# Patient Record
Sex: Female | Born: 1965 | Race: White | Hispanic: No | Marital: Married | Smoking: Never smoker
Health system: Southern US, Community
[De-identification: ages and names within clinical notes are randomized; demographics above are authoritative.]

## PROBLEM LIST (undated history)

## (undated) DIAGNOSIS — C801 Malignant (primary) neoplasm, unspecified: Secondary | ICD-10-CM

## (undated) HISTORY — DX: Malignant (primary) neoplasm, unspecified: C80.1

## (undated) HISTORY — PX: MOHS SURGERY: SUR867

---

## 1998-04-06 ENCOUNTER — Other Ambulatory Visit: Admission: RE | Admit: 1998-04-06 | Discharge: 1998-04-06 | Payer: Self-pay | Admitting: Obstetrics and Gynecology

## 1999-04-23 ENCOUNTER — Other Ambulatory Visit: Admission: RE | Admit: 1999-04-23 | Discharge: 1999-04-23 | Payer: Self-pay | Admitting: Obstetrics and Gynecology

## 2000-10-06 ENCOUNTER — Other Ambulatory Visit: Admission: RE | Admit: 2000-10-06 | Discharge: 2000-10-06 | Payer: Self-pay | Admitting: Obstetrics and Gynecology

## 2001-05-09 ENCOUNTER — Ambulatory Visit (HOSPITAL_COMMUNITY): Admission: RE | Admit: 2001-05-09 | Discharge: 2001-05-09 | Payer: Self-pay | Admitting: *Deleted

## 2001-12-18 ENCOUNTER — Other Ambulatory Visit: Admission: RE | Admit: 2001-12-18 | Discharge: 2001-12-18 | Payer: Self-pay | Admitting: Obstetrics and Gynecology

## 2004-04-23 ENCOUNTER — Other Ambulatory Visit: Admission: RE | Admit: 2004-04-23 | Discharge: 2004-04-23 | Payer: Self-pay | Admitting: Obstetrics and Gynecology

## 2005-10-31 ENCOUNTER — Other Ambulatory Visit: Admission: RE | Admit: 2005-10-31 | Discharge: 2005-10-31 | Payer: Self-pay | Admitting: Obstetrics and Gynecology

## 2006-08-30 ENCOUNTER — Inpatient Hospital Stay (HOSPITAL_COMMUNITY): Admission: AD | Admit: 2006-08-30 | Discharge: 2006-09-02 | Payer: Self-pay | Admitting: Obstetrics and Gynecology

## 2006-08-31 ENCOUNTER — Encounter (INDEPENDENT_AMBULATORY_CARE_PROVIDER_SITE_OTHER): Payer: Self-pay | Admitting: Specialist

## 2006-12-12 ENCOUNTER — Encounter (INDEPENDENT_AMBULATORY_CARE_PROVIDER_SITE_OTHER): Payer: Self-pay | Admitting: Obstetrics and Gynecology

## 2006-12-12 ENCOUNTER — Ambulatory Visit (HOSPITAL_COMMUNITY): Admission: RE | Admit: 2006-12-12 | Discharge: 2006-12-12 | Payer: Self-pay | Admitting: Obstetrics and Gynecology

## 2007-11-12 ENCOUNTER — Encounter: Admission: RE | Admit: 2007-11-12 | Discharge: 2007-11-12 | Payer: Self-pay | Admitting: Obstetrics and Gynecology

## 2008-04-24 ENCOUNTER — Encounter (INDEPENDENT_AMBULATORY_CARE_PROVIDER_SITE_OTHER): Payer: Self-pay | Admitting: Obstetrics and Gynecology

## 2008-04-24 ENCOUNTER — Ambulatory Visit (HOSPITAL_COMMUNITY): Admission: RE | Admit: 2008-04-24 | Discharge: 2008-04-24 | Payer: Self-pay | Admitting: Obstetrics and Gynecology

## 2010-06-25 ENCOUNTER — Inpatient Hospital Stay (HOSPITAL_COMMUNITY)
Admission: AD | Admit: 2010-06-25 | Discharge: 2010-06-27 | Payer: Self-pay | Source: Home / Self Care | Attending: Obstetrics and Gynecology | Admitting: Obstetrics and Gynecology

## 2010-06-27 ENCOUNTER — Encounter
Admission: RE | Admit: 2010-06-27 | Discharge: 2010-07-27 | Payer: Self-pay | Source: Home / Self Care | Attending: Obstetrics and Gynecology | Admitting: Obstetrics and Gynecology

## 2010-07-28 ENCOUNTER — Encounter (HOSPITAL_COMMUNITY): Payer: BC Managed Care – PPO | Attending: Obstetrics and Gynecology

## 2010-07-28 DIAGNOSIS — O923 Agalactia: Secondary | ICD-10-CM | POA: Insufficient documentation

## 2010-08-18 ENCOUNTER — Other Ambulatory Visit: Payer: Self-pay | Admitting: Obstetrics and Gynecology

## 2010-08-27 ENCOUNTER — Encounter (HOSPITAL_COMMUNITY)
Admission: RE | Admit: 2010-08-27 | Discharge: 2010-08-27 | Disposition: A | Discharge status: Home / Self Care | Payer: BC Managed Care – PPO | Source: Ambulatory Visit | Attending: Obstetrics and Gynecology | Admitting: Obstetrics and Gynecology

## 2010-08-27 DIAGNOSIS — O923 Agalactia: Secondary | ICD-10-CM | POA: Insufficient documentation

## 2010-09-06 LAB — CBC
MCH: 32.1 pg (ref 26.0–34.0)
MCV: 91.6 fL (ref 78.0–100.0)
Platelets: 148 10*3/uL — ABNORMAL LOW (ref 150–400)
Platelets: 189 10*3/uL (ref 150–400)
RBC: 3.41 MIL/uL — ABNORMAL LOW (ref 3.87–5.11)
RBC: 3.8 MIL/uL — ABNORMAL LOW (ref 3.87–5.11)
RDW: 13.6 % (ref 11.5–15.5)
RDW: 13.6 % (ref 11.5–15.5)
WBC: 10.8 10*3/uL — ABNORMAL HIGH (ref 4.0–10.5)

## 2010-09-06 LAB — RH IMMUNE GLOB WKUP(>/=20WKS)(NOT WOMEN'S HOSP): Unit division: 0

## 2010-09-06 LAB — RPR: RPR Ser Ql: NONREACTIVE

## 2010-09-27 ENCOUNTER — Encounter (HOSPITAL_COMMUNITY)
Admission: RE | Admit: 2010-09-27 | Discharge: 2010-09-27 | Disposition: A | Discharge status: Home / Self Care | Payer: BC Managed Care – PPO | Source: Ambulatory Visit | Attending: Obstetrics and Gynecology | Admitting: Obstetrics and Gynecology

## 2010-09-27 DIAGNOSIS — O923 Agalactia: Secondary | ICD-10-CM | POA: Insufficient documentation

## 2010-10-28 ENCOUNTER — Encounter (HOSPITAL_COMMUNITY)
Admission: RE | Admit: 2010-10-28 | Discharge: 2010-10-28 | Disposition: A | Discharge status: Home / Self Care | Payer: BC Managed Care – PPO | Source: Ambulatory Visit | Attending: Obstetrics and Gynecology | Admitting: Obstetrics and Gynecology

## 2010-10-28 DIAGNOSIS — O923 Agalactia: Secondary | ICD-10-CM | POA: Insufficient documentation

## 2010-11-09 NOTE — Op Note (Signed)
[name], [name]               ACCOUNT NO.:  [id number]   MEDICAL RECORD NO.:  [id number]          PATIENT TYPE:  AMB   LOCATION:  SDC                           FACILITY:  [facility]   PHYSICIAN:  Lenoard Aden, M.D.DATE OF BIRTH:  16-Apr-1966   DATE OF PROCEDURE:  12/12/2006  DATE OF DISCHARGE:                               OPERATIVE REPORT   PREOPERATIVE DIAGNOSES:  1. Vaginal stenosis.  2. Vaginal wall cyst.   POSTOPERATIVE DIAGNOSES:  1. Vaginal stenosis.  2. Vaginal wall cyst.   PROCEDURE:  Removal of vaginal wall cyst and vaginal paucity with  removal of vaginal wall scar tissue.   SURGEON:  Lenoard Aden, MD.   ANESTHESIA:  MAC and local.   SPECIMENS:  Vaginal wall cyst to Pathology.   The patient to recovery room in good condition.   BRIEF OPERATIVE NOTE:  After being apprised of the risks of anesthesia;  infection, bleeding, injury to the adjacent organs, the possible need  for repair and inability to cure, and dyspareunia, the patient is taken  to the operating room where my assistant administered IV sedation  without difficulty, prepped and draped in the usual sterile fashion,  catheterized until the bladder was empty.  Exam under anesthesia  revealed a stenotic posterior vaginal vault with a vaginal wall  cyst/mass in the area in between the hymenal remnant and the fossa  navicularis.  This area is elevated and excised at the base at this  time.  The posterior wall of the vagina and the area of the posterior  fourchette is excised in a vertical fashion.  This area is undermined  extensively creating a relief of the stenotic area in the posterior area  as noted.  This area, which has been opened in a vertical fashion, is  closed in a horizontal fashion after achieving good hemostasis using  electrocautery.  At this time, it is closed with multiple interrupted  sutures using a #3-0 Vicryl repeat suture, 20 ml of a dilute 0.50%  Marcaine solution is placed,  the specimen is sent to Pathology. The  patient tolerates the procedure well and is awakened and transferred to  recovery in good condition.      Lenoard Aden, M.D.  Electronically Signed     RJT/MEDQ  D:  12/12/2006  T:  12/12/2006  Job:  [id number]

## 2010-11-09 NOTE — Op Note (Signed)
Isabella Davis, Isabella Davis               ACCOUNT NO.:  [id number]   MEDICAL RECORD NO.:  [id number]          PATIENT TYPE:  AMB   LOCATION:  SDC                           FACILITY:  [facility]   PHYSICIAN:  Lenoard Aden, M.D.DATE OF BIRTH:  03/06/1966   DATE OF PROCEDURE:  04/24/2008  DATE OF DISCHARGE:                               OPERATIVE REPORT   PREOPERATIVE DIAGNOSES:  Pregnancy in second trimester, pregnancy loss  at 12 weeks.   POSTOPERATIVE DIAGNOSES:  Pregnancy in second trimester, pregnancy loss  at 12 weeks.   PROCEDURE:  Suction dilation and evacuation.   SURGEON:  Lenoard Aden, MD   ANESTHESIA:  MAC paracervical.   ESTIMATED BLOOD LOSS:  50 mL.   COMPLICATIONS:  None.   DRAINS:  None.   COUNTS:  Correct.   Patient to recovery room in good condition.  Tissue to pathology and  also for chromosomal analysis.   BRIEF OPERATIVE NOTE:  After being apprised of the risks of anesthesia,  infection, bleeding, uterine perforation, possible need for repair, the  patient was brought to the operating room and was administered IV  sedation without difficulty, prepped and draped in sterile fashion, and  catheterized until the bladder was empty.  Examination under anesthesia  reveals a 10-week size uterus, anteflexed.  No adnexal masses.  Cervix  was easily dilated up to a #29 Pratt dilator, 9-mm curved suction  curette placed.  Aspiration reveals products of conception that were  identified and collected.  Repeat suction and then curettage in a gentle  4-quadrant method reveals cavity to be empty.  Good hemostasis noted.  Please note that dilute paracervical block was placed in the standard  fashion at 4 and 8 o'clock using dilute Xylocaine solution without  difficulty.  At the termination of the procedure, the patient tolerated  the procedure well and was transferred to recovery room in good  condition.      Lenoard Aden, M.D.  Electronically Signed     RJT/MEDQ  D:  04/24/2008  T:  04/25/2008  Job:  [id number]

## 2010-11-12 NOTE — H&P (Signed)
[name], [name]               ACCOUNT NO.:  [id number]   MEDICAL RECORD NO.:  [id number]          PATIENT TYPE:  INP   LOCATION:  9167                          FACILITY:  [facility]   PHYSICIAN:  Lenoard Aden, M.D.DATE OF BIRTH:  [birth year]   DATE OF ADMISSION:  08/30/2006  DATE OF DISCHARGE:                              HISTORY & PHYSICAL   CHIEF COMPLAINT:  Oligohydramnios.   HISTORY OF PRESENT ILLNESS:  The patient is a 45 year old white female,  G1, P0, at [redacted] weeks gestation and AFI of less than the 5th percentile in  the office today.  She is here for induction.   ALLERGIES:  No known drug allergies.   MEDICATIONS:  Prenatal vitamins.   FAMILY HISTORY:  Myocardial infarction, hypertension, thyroid  discomfort.   Personal history of previous history of Chlamydia exposure. History of  asthma, currently on no medications.   She does not smoke and does not drink.   Pregnancy complicated by size/dates discrepancy and borderline AFI.   PHYSICAL EXAMINATION:  GENERAL:  Well developed, well nourished white female in no acute  distress.  HEENT:  Normal.  LUNGS:  Clear.  HEART:  Regular rate and rhythm.  ABDOMEN:  Soft, gravid, and nontender.  Estimated fetal weight by  ultrasound 7 1/2 pounds.  PELVIC:  Cervix is 2 cm, 50% vertex, -1.  EXTREMITIES:  No cords.  NEUROLOGICAL:  Nonfocal.   NST is reactive.  Cervidil is placed.   IMPRESSION:  40 week intrauterine pregnancy, oligohydramnios.   PLAN:  Induction, cervical ripening.     Lenoard Aden, M.D.  Electronically Signed    RJT/MEDQ  D:  08/30/2006  T:  08/30/2006  Job:  [id number]

## 2011-01-26 ENCOUNTER — Other Ambulatory Visit: Payer: Self-pay | Admitting: Dermatology

## 2011-03-28 LAB — CBC
HCT: 38
Hemoglobin: 13
RBC: 4.18
RDW: 12.9
WBC: 6.1

## 2011-04-13 LAB — CBC
HCT: 41.6
Hemoglobin: 14.2
MCHC: 34.2
MCV: 88.7
RBC: 4.7
WBC: 4

## 2011-04-13 LAB — HCG, SERUM, QUALITATIVE: Preg, Serum: NEGATIVE

## 2011-08-15 ENCOUNTER — Other Ambulatory Visit: Payer: Self-pay | Admitting: Dermatology

## 2012-07-23 ENCOUNTER — Other Ambulatory Visit: Payer: Self-pay | Admitting: Dermatology

## 2012-09-12 ENCOUNTER — Other Ambulatory Visit: Payer: Self-pay | Admitting: Dermatology

## 2013-03-11 ENCOUNTER — Other Ambulatory Visit: Payer: Self-pay | Admitting: Dermatology

## 2013-03-29 ENCOUNTER — Ambulatory Visit (INDEPENDENT_AMBULATORY_CARE_PROVIDER_SITE_OTHER): Payer: Self-pay | Admitting: Surgery

## 2013-03-29 ENCOUNTER — Ambulatory Visit (INDEPENDENT_AMBULATORY_CARE_PROVIDER_SITE_OTHER): Payer: BC Managed Care – PPO | Admitting: Surgery

## 2013-03-29 ENCOUNTER — Encounter (INDEPENDENT_AMBULATORY_CARE_PROVIDER_SITE_OTHER): Payer: Self-pay | Admitting: Surgery

## 2013-03-29 VITALS — BP 114/68 | HR 76 | Temp 97.7°F | Resp 14 | Ht 64.0 in | Wt 128.0 lb

## 2013-03-29 DIAGNOSIS — D1720 Benign lipomatous neoplasm of skin and subcutaneous tissue of unspecified limb: Secondary | ICD-10-CM

## 2013-03-29 DIAGNOSIS — D1739 Benign lipomatous neoplasm of skin and subcutaneous tissue of other sites: Secondary | ICD-10-CM

## 2013-03-29 NOTE — Patient Instructions (Signed)
See [place] again if any changes

## 2013-03-29 NOTE — Progress Notes (Addendum)
Patient ID: Isabella Davis, female   DOB: 09/25/1965, 48 y.o.   MRN: [id number]  Chief Complaint  Patient presents with  . Breast Problem    Righjt breast lump    HPI Isabella Davis is a 47 y.o. female.  HPI She presents with a right chest wall lump x 1 month. She was referred for evaluation by Dr Billy Coast.  Past Medical History  Diagnosis Date  . Cancer     skin    Past Surgical History  Procedure Laterality Date  . Mohs surgery      History reviewed. No pertinent family history.  Social History History  Substance Use Topics  . Smoking status: Never Smoker   . Smokeless tobacco: Never Used  . Alcohol Use: No    No Known Allergies  No current outpatient prescriptions on file.   No current facility-administered medications for this visit.    Review of Systems Review of Systems  Constitutional: Negative for fever, chills and unexpected weight change.  HENT: Negative for hearing loss, congestion, sore throat, trouble swallowing and voice change.   Eyes: Negative for visual disturbance.  Respiratory: Negative for cough and wheezing.   Cardiovascular: Negative for chest pain, palpitations and leg swelling.  Gastrointestinal: Negative for nausea, vomiting, abdominal pain, diarrhea, constipation, blood in stool, abdominal distention and anal bleeding.  Genitourinary: Negative for hematuria, vaginal bleeding and difficulty urinating.  Musculoskeletal: Negative for arthralgias.  Skin: Negative for rash and wound.  Neurological: Negative for seizures, syncope and headaches.  Hematological: Negative for adenopathy. Does not bruise/bleed easily.  Psychiatric/Behavioral: Negative for confusion.  All other systems reviewed and are negative.    Blood pressure 114/68, pulse 76, temperature 97.7 F (36.5 C), temperature source Temporal, resp. rate 14, height 5\' 4"  (1.626 m), weight 128 lb (58.06 kg).  Physical Exam Physical Exam  Constitutional: She appears well-developed and  well-nourished.  Neck: No thyromegaly present.  Pulmonary/Chest: She exhibits no tenderness. Right breast exhibits mass. Right breast exhibits no inverted nipple, no nipple discharge, no skin change and no tenderness. Left breast exhibits no inverted nipple, no mass, no nipple discharge, no skin change and no tenderness. Breasts are symmetrical.    The mass is located on the right chest wall more on the ribs. It doesn't feel like breast tissue, but more like a lipoma. Will review imaging to confirm.  Lymphadenopathy:       Right cervical: No superficial cervical and no deep cervical adenopathy present.      Left cervical: No superficial cervical and no deep cervical adenopathy present.    She has no axillary adenopathy.       Left: No supraclavicular adenopathy present.  Skin: No rash noted. No erythema.    Data Reviewed I have reviewed the ultrasound report - normal  Assessment    Prob lipoma, left lower axillary area, lateral; to breast      Plan    Discussed with patient and her husband. Recommended F/U no surgery at this time        Jonh Mcqueary J 03/29/2013, 9:48 AM   CC: Lenoard Aden, MD

## 2013-04-03 ENCOUNTER — Encounter (INDEPENDENT_AMBULATORY_CARE_PROVIDER_SITE_OTHER): Payer: Self-pay

## 2013-04-29 ENCOUNTER — Other Ambulatory Visit: Payer: Self-pay | Admitting: Dermatology

## 2014-04-28 ENCOUNTER — Other Ambulatory Visit: Payer: Self-pay | Admitting: Dermatology

## 2014-10-02 ENCOUNTER — Other Ambulatory Visit: Payer: Self-pay | Admitting: Dermatology

## 2016-02-28 ENCOUNTER — Emergency Department (HOSPITAL_BASED_OUTPATIENT_CLINIC_OR_DEPARTMENT_OTHER)
Admission: EM | Admit: 2016-02-28 | Discharge: 2016-02-28 | Disposition: A | Discharge status: Home / Self Care | Payer: 59 | Attending: Emergency Medicine | Admitting: Emergency Medicine

## 2016-02-28 ENCOUNTER — Encounter (HOSPITAL_BASED_OUTPATIENT_CLINIC_OR_DEPARTMENT_OTHER): Payer: Self-pay | Admitting: Emergency Medicine

## 2016-02-28 ENCOUNTER — Emergency Department (HOSPITAL_BASED_OUTPATIENT_CLINIC_OR_DEPARTMENT_OTHER): Payer: 59

## 2016-02-28 DIAGNOSIS — Z85828 Personal history of other malignant neoplasm of skin: Secondary | ICD-10-CM | POA: Diagnosis not present

## 2016-02-28 DIAGNOSIS — M25572 Pain in left ankle and joints of left foot: Secondary | ICD-10-CM | POA: Insufficient documentation

## 2016-02-28 MED ORDER — NAPROXEN 500 MG PO TABS: 500.0000 mg | ORAL_TABLET | Freq: Two times a day (BID) | ORAL | 0 refills | Status: AC

## 2016-02-28 MED ORDER — NAPROXEN 250 MG PO TABS
500.0000 mg | ORAL_TABLET | Freq: Once | ORAL | Status: AC
  Administered 2016-02-28: 500 mg via ORAL
  Filled 2016-02-28: qty 2

## 2016-02-28 NOTE — ED Notes (Signed)
Patient reports she twisted her ankle at [place] today. Patient states initially she was able to continue walking on it. States that later on the way home "they stopped and I couldn't even put weight on it". Patient has not tried any interventions for pain.

## 2016-02-28 NOTE — ED Notes (Signed)
Called Patients name twice and no answer.

## 2016-02-28 NOTE — ED Provider Notes (Signed)
[facility] Provider Note   CSN: [id number] Arrival date & time: 02/28/16  1849  By signing my name below, I, Irene Pap, attest that this documentation has been prepared under the direction and in the presence of Gloriann Loan, PA-C. Electronically Signed: Irene Pap, ED Scribe. 02/28/16. 8:49 PM.  History   Chief Complaint Chief Complaint  Patient presents with  . Ankle Pain   The history is provided by the patient. No language interpreter was used.  HPI Comments: Isabella Davis is a 50 y.o. female who presents to the Emergency Department complaining of gradually worsening left ankle pain onset 5 hours ago. Pt notes worsening pain with ambulation and weightbearing. Pt was hiking when she tripped and fell, twisting her ankle. She says that she was initially able to bear weight leaving the hiking trail, but her pain reached a point where she could not bear weight. She has not tried anything for her pain. She denies hitting head, LOC, pain elsewhere, color change, wound, numbness, or weakness.   Past Medical History:  Diagnosis Date  . Cancer ([place])    skin    There are no active problems to display for this patient.   Past Surgical History:  Procedure Laterality Date  . MOHS SURGERY      OB History    No data available       Home Medications    Prior to Admission medications   Medication Sig Start Date End Date Taking? Authorizing Provider  naproxen (NAPROSYN) 500 MG tablet Take 1 tablet (500 mg total) by mouth 2 (two) times daily. 02/28/16   Gloriann Loan, PA-C    Family History History reviewed. No pertinent family history.  Social History Social History  Substance Use Topics  . Smoking status: Never Smoker  . Smokeless tobacco: Never Used  . Alcohol use No     Allergies   Review of patient's allergies indicates no known allergies.   Review of Systems Review of Systems  Musculoskeletal: Positive for arthralgias.  Skin: Negative for color  change and wound.  Neurological: Negative for syncope, weakness and numbness.     Physical Exam Updated Vital Signs BP (!) 110/47 (BP Location: Right Arm)   Pulse 61   Temp 98.1 F (36.7 C) (Oral)   Resp 16   Ht 5\' 5"  (1.651 m)   Wt 61.2 kg   SpO2 100%   BMI 22.47 kg/m   Physical Exam  Constitutional: She is oriented to person, place, and time. She appears well-developed and well-nourished.  HENT:  Head: Normocephalic and atraumatic.  Right Ear: External ear normal.  Left Ear: External ear normal.  Eyes: Conjunctivae are normal. No scleral icterus.  Neck: No tracheal deviation present.  Cardiovascular:  Pulses:      Dorsalis pedis pulses are 2+ on the right side, and 2+ on the left side.  Pulmonary/Chest: Effort normal. No respiratory distress.  Abdominal: She exhibits no distension.  Musculoskeletal:  Left ankle: Slightly decreased ROM, flexion and extension due to pain. No swelling, bruising or erythema.   Neurological: She is alert and oriented to person, place, and time.  Strength intact in ankle flexion and extension; sensation intact  Skin: Skin is warm and dry.  Psychiatric: She has a normal mood and affect. Her behavior is normal.     ED Treatments / Results  DIAGNOSTIC STUDIES: Oxygen Saturation is 100% on RA, normal by my interpretation.    COORDINATION OF CARE: 8:47 PM-Discussed treatment plan which includes brace,  RICE protocol, and anti-inflammatories with pt at bedside and pt agreed to plan.    Labs (all labs ordered are listed, but only abnormal results are displayed) Labs Reviewed - No data to display  EKG  EKG Interpretation None       Radiology Dg Ankle Complete Left  Result Date: 02/28/2016 CLINICAL DATA:  Twisting injury this afternoon.  Lateral ankle pain. EXAM: LEFT ANKLE COMPLETE - 3+ VIEW COMPARISON:  None. FINDINGS: The ankle mortise is maintained. No acute ankle fracture. No osteochondral lesion. No joint effusion. The mid and  hindfoot bony structures are intact. IMPRESSION: No acute fracture. Electronically Signed   By: Marijo Sanes M.D.   On: 02/28/2016 19:16    Procedures Procedures (including critical care time)  Medications Ordered in ED Medications  naproxen (NAPROSYN) tablet 500 mg (not administered)     Initial Impression / Assessment and Plan / ED Course  I have reviewed the triage vital signs and the nursing notes.  Pertinent labs & imaging results that were available during my care of the patient were reviewed by me and considered in my medical decision making (see chart for details).  Clinical Course   Patient X-Ray negative for obvious fracture or dislocation.  Pt advised to follow up with orthopedics. Patient given brace while in ED, conservative therapy recommended and discussed. Patient will be discharged home & is agreeable with above plan. Returns precautions discussed. Pt appears safe for discharge.  I personally performed the services described in this documentation, which was scribed in my presence. The recorded information has been reviewed and is accurate.   Final Clinical Impressions(s) / ED Diagnoses  1. Left ankle pain   New Prescriptions New Prescriptions   NAPROXEN (NAPROSYN) 500 MG TABLET    Take 1 tablet (500 mg total) by mouth 2 (two) times daily.     Gloriann Loan, PA-C 02/28/16 2052    Sherwood Gambler, MD 03/08/16 [phone]

## 2016-02-28 NOTE — ED Triage Notes (Signed)
Patient states that she is having left ankle pain after hiking today

## 2017-03-10 IMAGING — CR DG ANKLE COMPLETE 3+V*L*
3 series · 3 of 3 positions shown · non-contrast
Comparison: None.

CLINICAL DATA: Twisting injury this afternoon.  Lateral ankle pain.

EXAM:
LEFT ANKLE COMPLETE - 3+ VIEW

[t ankle joint ap left]
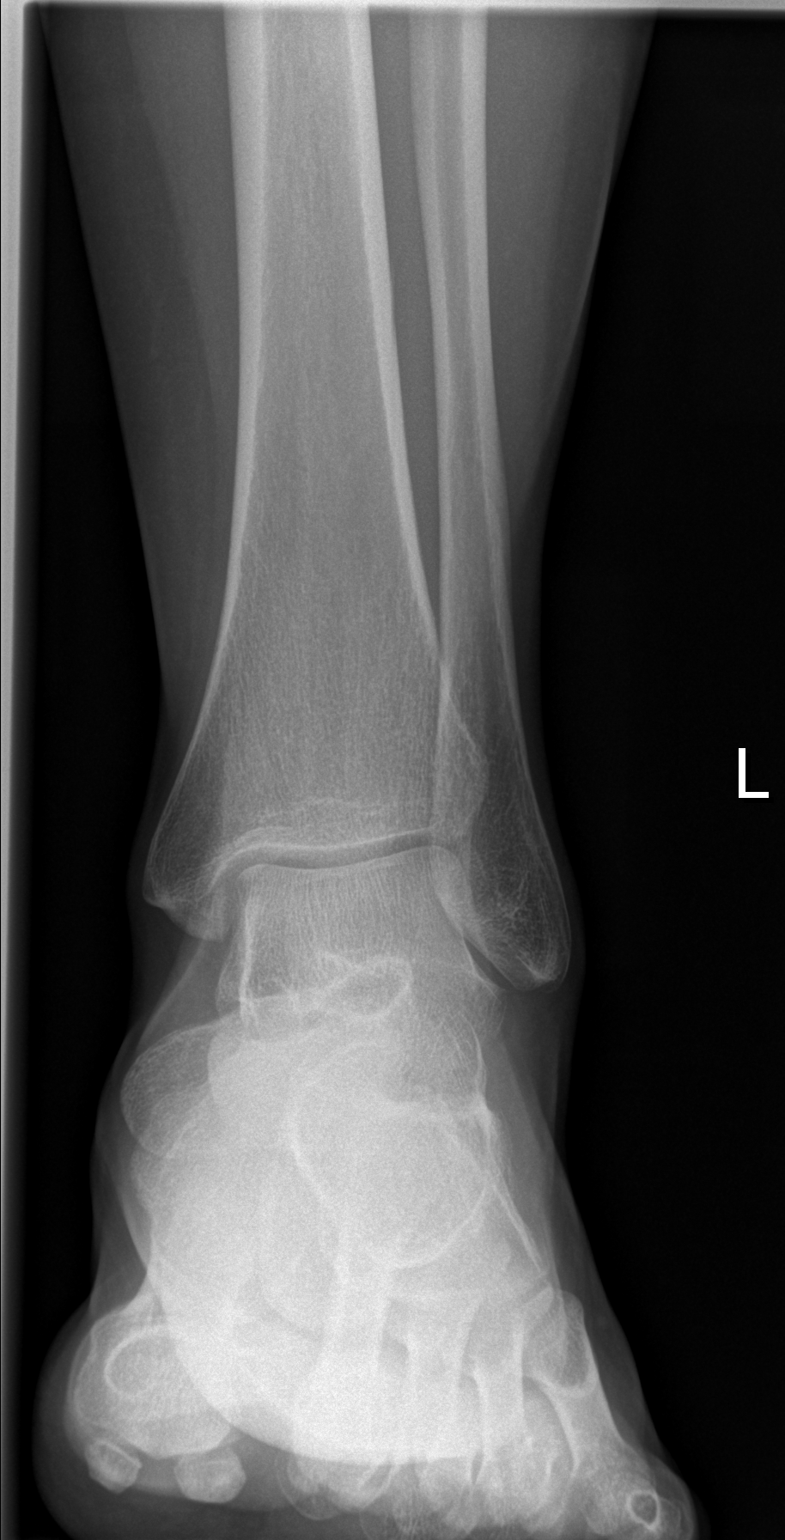

[t ankle joint oblique left]
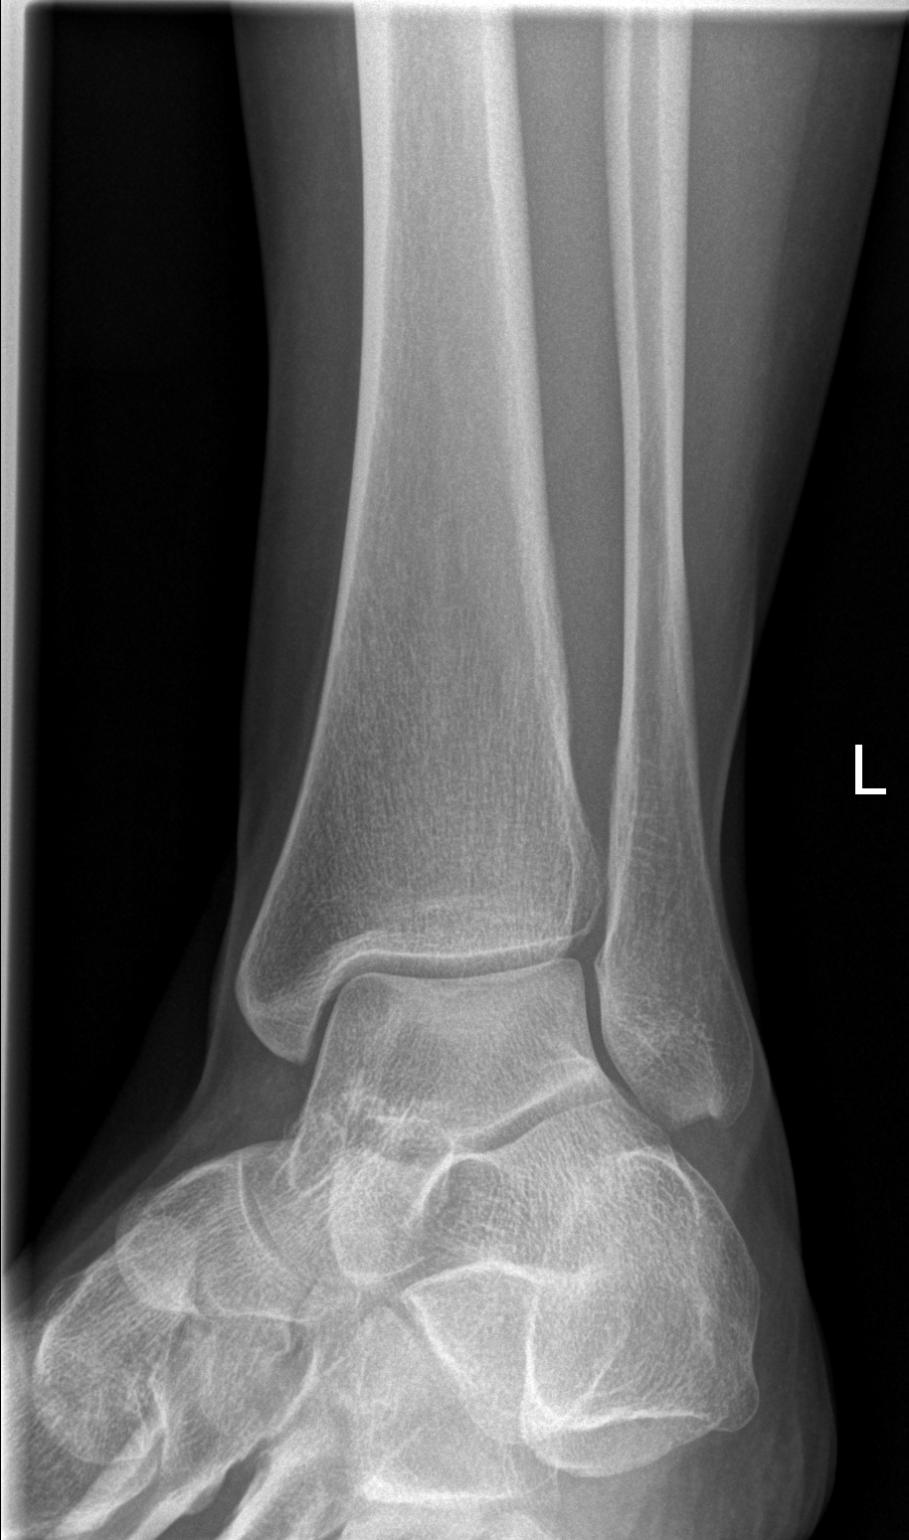

[t ankle joint lat left]
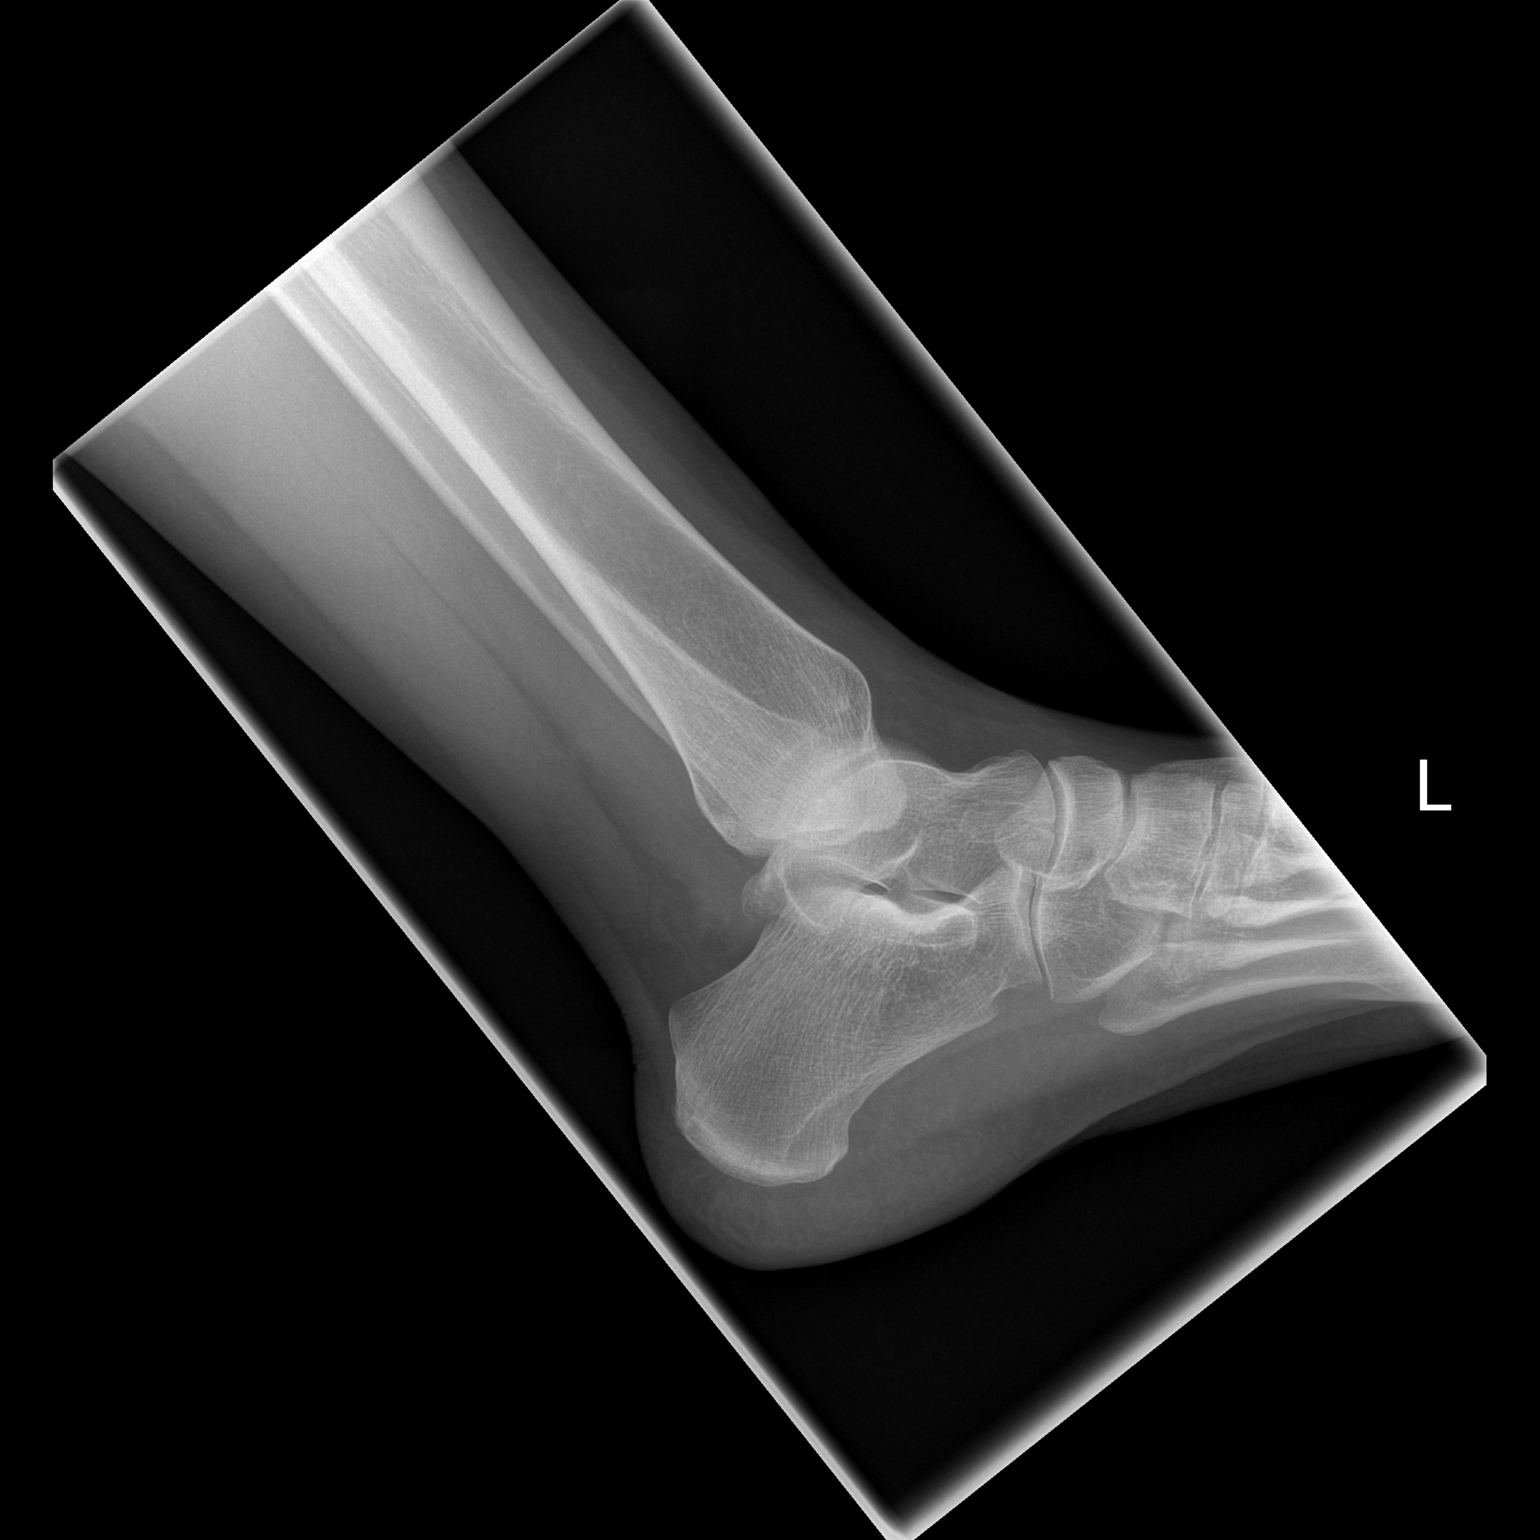

[3 of 3 positions shown; findings below may reference images not displayed]

FINDINGS: The ankle mortise is maintained. No acute ankle fracture. No
osteochondral lesion. No joint effusion. The mid and hindfoot bony
structures are intact.
IMPRESSION: No acute fracture.
